# Patient Record
Sex: Female | Born: 1965
Health system: Southern US, Community
[De-identification: ages and names within clinical notes are randomized; demographics above are authoritative.]

## PROBLEM LIST (undated history)

## (undated) DIAGNOSIS — R079 Chest pain, unspecified: Secondary | ICD-10-CM

## (undated) DIAGNOSIS — I10 Essential (primary) hypertension: Secondary | ICD-10-CM

## (undated) DIAGNOSIS — R002 Palpitations: Secondary | ICD-10-CM

## (undated) HISTORY — DX: Palpitations: R00.2

## (undated) HISTORY — DX: Chest pain, unspecified: R07.9

---

## 1997-10-28 ENCOUNTER — Ambulatory Visit (HOSPITAL_COMMUNITY): Admission: RE | Admit: 1997-10-28 | Discharge: 1997-10-28 | Payer: Self-pay | Admitting: Ophthalmology

## 1999-01-01 ENCOUNTER — Encounter: Payer: Self-pay | Admitting: Obstetrics and Gynecology

## 1999-01-01 ENCOUNTER — Ambulatory Visit (HOSPITAL_COMMUNITY): Admission: RE | Admit: 1999-01-01 | Discharge: 1999-01-01 | Payer: Self-pay | Admitting: Obstetrics and Gynecology

## 1999-03-11 ENCOUNTER — Ambulatory Visit (HOSPITAL_COMMUNITY): Admission: RE | Admit: 1999-03-11 | Discharge: 1999-03-11 | Payer: Self-pay | Admitting: Obstetrics and Gynecology

## 1999-03-11 ENCOUNTER — Encounter: Payer: Self-pay | Admitting: Obstetrics and Gynecology

## 1999-03-30 ENCOUNTER — Ambulatory Visit (HOSPITAL_COMMUNITY): Admission: RE | Admit: 1999-03-30 | Discharge: 1999-03-30 | Payer: Self-pay | Admitting: Obstetrics and Gynecology

## 1999-04-06 ENCOUNTER — Encounter (HOSPITAL_COMMUNITY): Admission: RE | Admit: 1999-04-06 | Discharge: 1999-04-23 | Payer: Self-pay | Admitting: Obstetrics and Gynecology

## 1999-04-06 ENCOUNTER — Inpatient Hospital Stay (HOSPITAL_COMMUNITY): Admission: AD | Admit: 1999-04-06 | Discharge: 1999-04-06 | Payer: Self-pay | Admitting: Obstetrics and Gynecology

## 1999-04-08 ENCOUNTER — Inpatient Hospital Stay (HOSPITAL_COMMUNITY): Admission: AD | Admit: 1999-04-08 | Discharge: 1999-04-12 | Payer: Self-pay | Admitting: Obstetrics and Gynecology

## 1999-04-09 ENCOUNTER — Encounter: Payer: Self-pay | Admitting: Obstetrics and Gynecology

## 1999-04-22 ENCOUNTER — Inpatient Hospital Stay (HOSPITAL_COMMUNITY): Admission: AD | Admit: 1999-04-22 | Discharge: 1999-04-26 | Payer: Self-pay | Admitting: Obstetrics and Gynecology

## 1999-04-22 ENCOUNTER — Encounter (INDEPENDENT_AMBULATORY_CARE_PROVIDER_SITE_OTHER): Payer: Self-pay

## 1999-04-29 ENCOUNTER — Emergency Department (HOSPITAL_COMMUNITY): Admission: EM | Admit: 1999-04-29 | Discharge: 1999-04-29 | Payer: Self-pay | Admitting: Emergency Medicine

## 2000-01-24 ENCOUNTER — Other Ambulatory Visit: Admission: RE | Admit: 2000-01-24 | Discharge: 2000-01-24 | Payer: Self-pay | Admitting: Obstetrics and Gynecology

## 2000-02-24 ENCOUNTER — Ambulatory Visit (HOSPITAL_COMMUNITY): Admission: RE | Admit: 2000-02-24 | Discharge: 2000-02-24 | Payer: Self-pay | Admitting: Family Medicine

## 2000-02-24 ENCOUNTER — Encounter: Payer: Self-pay | Admitting: Family Medicine

## 2002-04-12 ENCOUNTER — Other Ambulatory Visit: Admission: RE | Admit: 2002-04-12 | Discharge: 2002-04-12 | Payer: Self-pay | Admitting: Obstetrics & Gynecology

## 2002-10-14 ENCOUNTER — Encounter: Payer: Self-pay | Admitting: Obstetrics & Gynecology

## 2002-10-14 ENCOUNTER — Ambulatory Visit (HOSPITAL_COMMUNITY): Admission: RE | Admit: 2002-10-14 | Discharge: 2002-10-14 | Payer: Self-pay | Admitting: Obstetrics & Gynecology

## 2002-10-15 ENCOUNTER — Inpatient Hospital Stay (HOSPITAL_COMMUNITY): Admission: AD | Admit: 2002-10-15 | Discharge: 2002-10-17 | Payer: Self-pay | Admitting: Obstetrics & Gynecology

## 2002-10-15 ENCOUNTER — Encounter (INDEPENDENT_AMBULATORY_CARE_PROVIDER_SITE_OTHER): Payer: Self-pay

## 2002-10-16 ENCOUNTER — Encounter (INDEPENDENT_AMBULATORY_CARE_PROVIDER_SITE_OTHER): Payer: Self-pay | Admitting: Specialist

## 2002-11-26 ENCOUNTER — Other Ambulatory Visit: Admission: RE | Admit: 2002-11-26 | Discharge: 2002-11-26 | Payer: Self-pay | Admitting: Obstetrics & Gynecology

## 2003-06-23 ENCOUNTER — Emergency Department (HOSPITAL_COMMUNITY): Admission: EM | Admit: 2003-06-23 | Discharge: 2003-06-23 | Payer: Self-pay | Admitting: Emergency Medicine

## 2003-08-08 ENCOUNTER — Ambulatory Visit (HOSPITAL_COMMUNITY): Admission: RE | Admit: 2003-08-08 | Discharge: 2003-08-08 | Payer: Self-pay | Admitting: Family Medicine

## 2005-01-23 ENCOUNTER — Emergency Department (HOSPITAL_COMMUNITY): Admission: EM | Admit: 2005-01-23 | Discharge: 2005-01-23 | Payer: Self-pay | Admitting: Emergency Medicine

## 2005-11-23 ENCOUNTER — Other Ambulatory Visit: Admission: RE | Admit: 2005-11-23 | Discharge: 2005-11-23 | Payer: Self-pay | Admitting: Obstetrics and Gynecology

## 2007-08-02 ENCOUNTER — Emergency Department (HOSPITAL_COMMUNITY): Admission: EM | Admit: 2007-08-02 | Discharge: 2007-08-02 | Payer: Self-pay | Admitting: Emergency Medicine

## 2007-10-27 ENCOUNTER — Emergency Department (HOSPITAL_COMMUNITY): Admission: EM | Admit: 2007-10-27 | Discharge: 2007-10-28 | Payer: Self-pay | Admitting: Emergency Medicine

## 2009-02-13 ENCOUNTER — Emergency Department (HOSPITAL_COMMUNITY): Admission: EM | Admit: 2009-02-13 | Discharge: 2009-02-13 | Payer: Self-pay | Admitting: Emergency Medicine

## 2009-07-26 IMAGING — US US SOFT TISSUE HEAD/NECK
1 series · 14 of 25 positions shown · non-contrast
Comparison: Neck CT on 10/27/2007

CLINICAL DATA: Left thyroid nodule.

THYROID ULTRASOUND
TECHNIQUE: Ultrasound examination of the thyroid gland and all
adjacent soft tissues was performed.

[Series 1: unknown · 0.11mm/px · 14 of 52 slices shown]
[im 1/52]
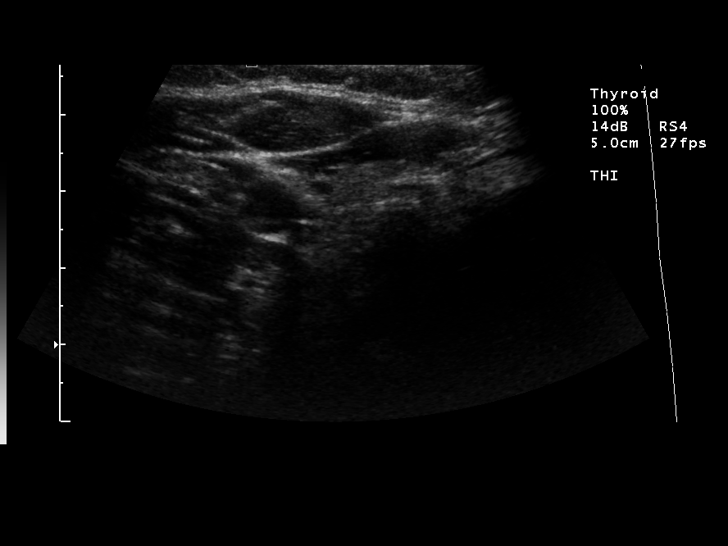
[im 5/52]
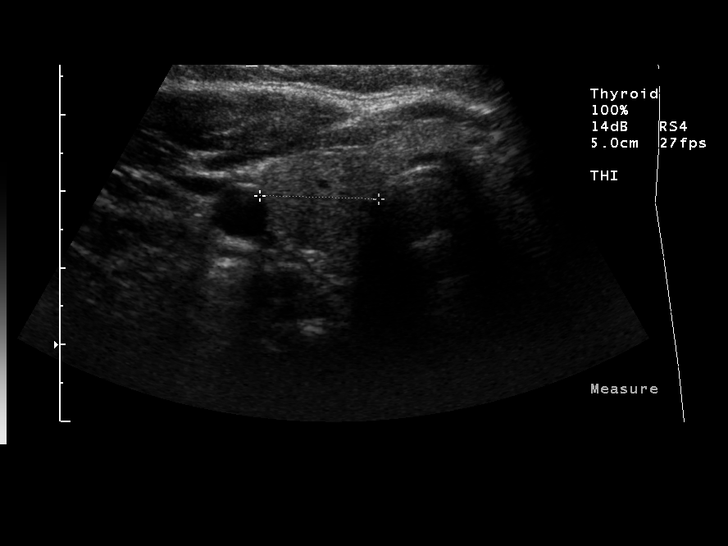
[im 9/52]
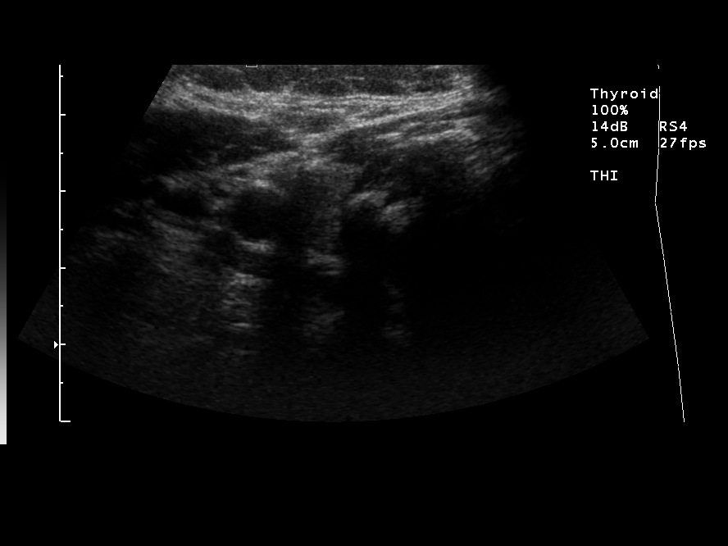
[im 13/52]
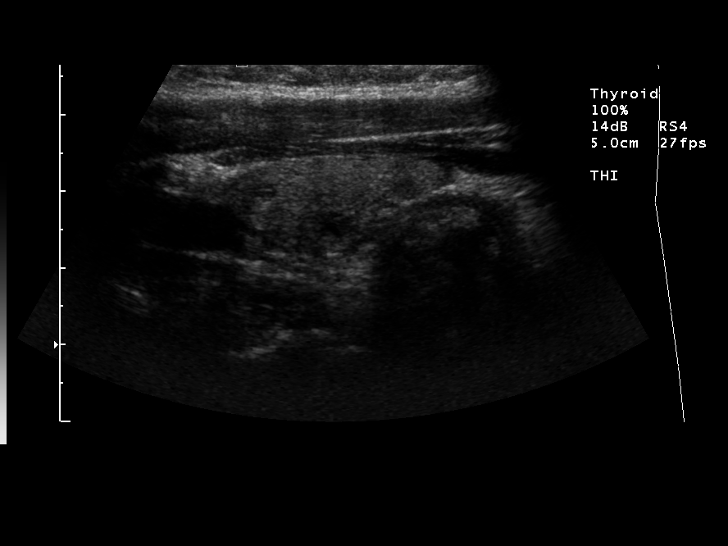
[im 18/52]
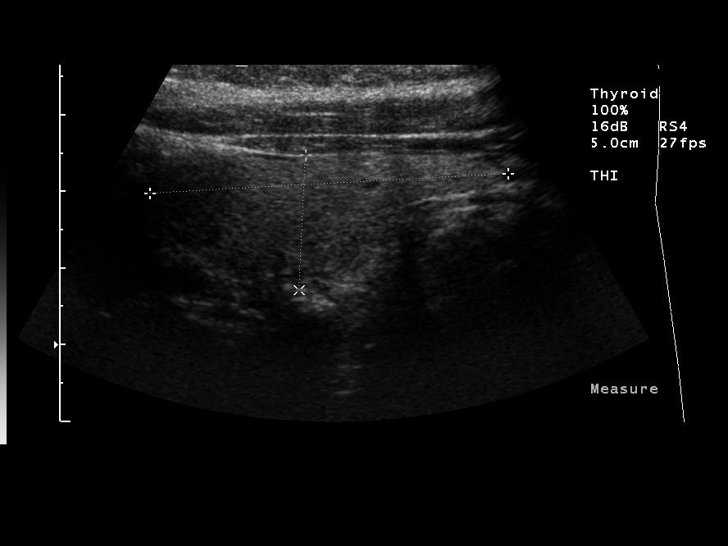
[im 20/52]
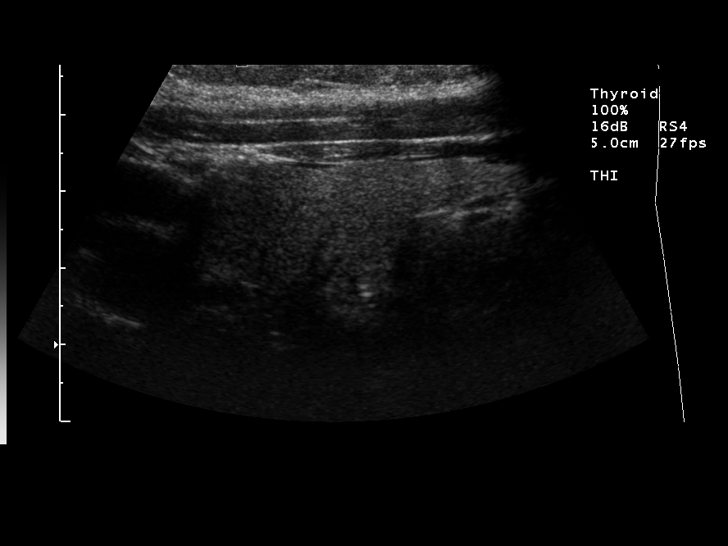
[im 24/52]
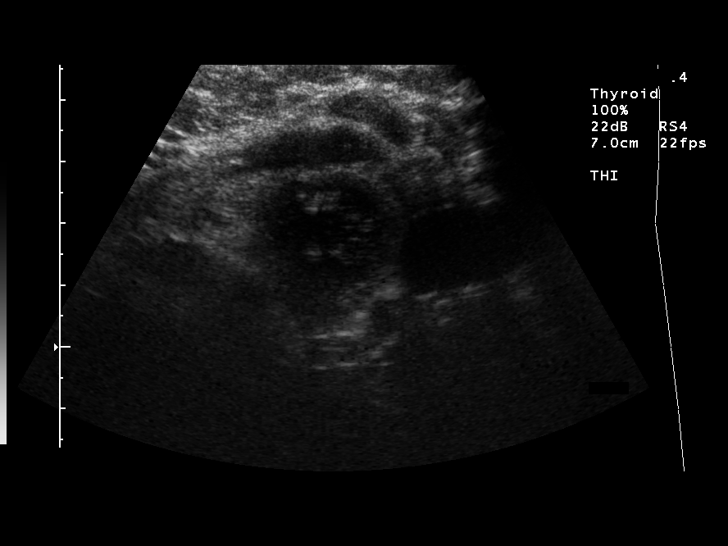
[im 28/52]
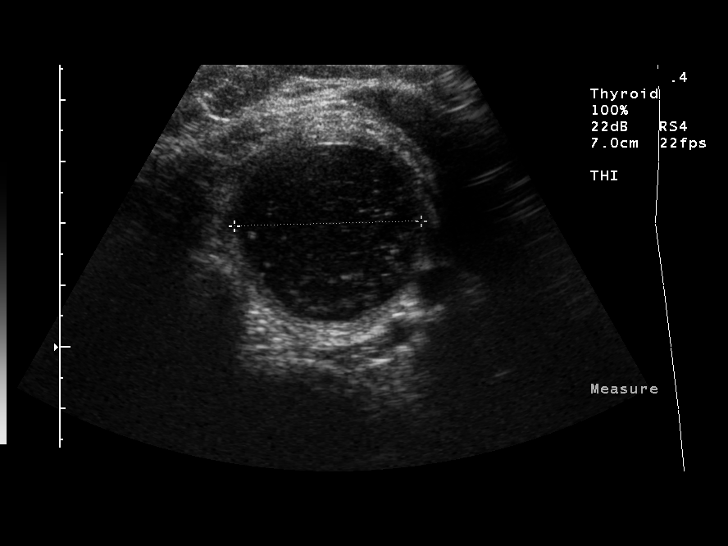
[im 32/52]
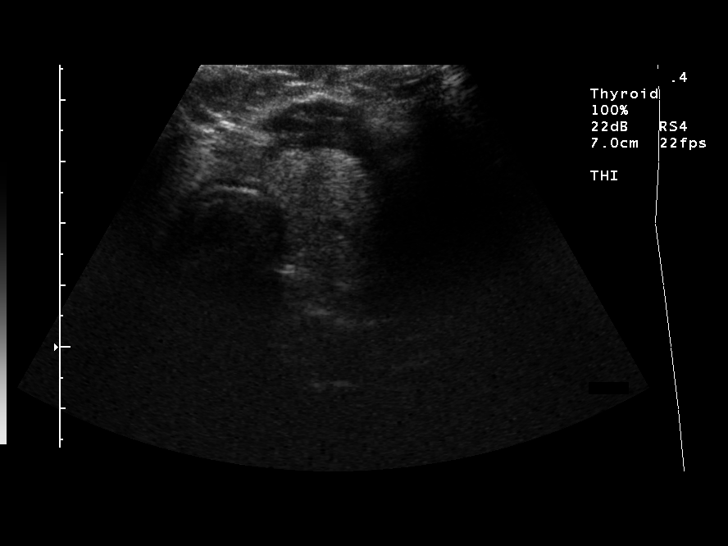
[im 35/52]
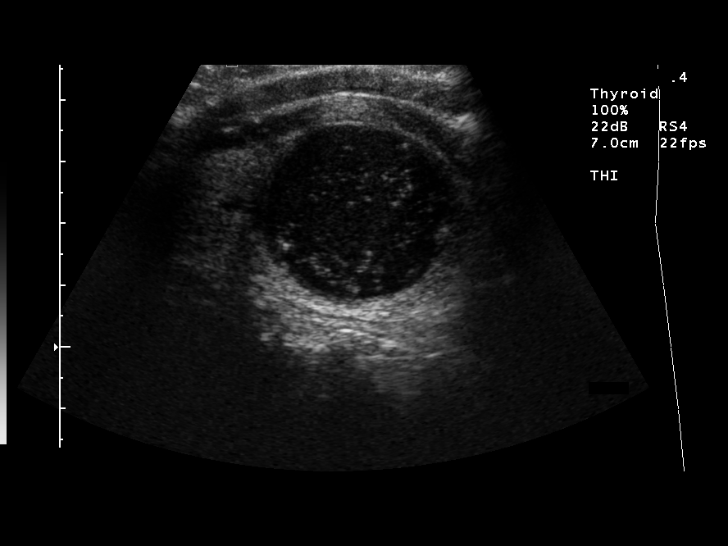
[im 39/52]
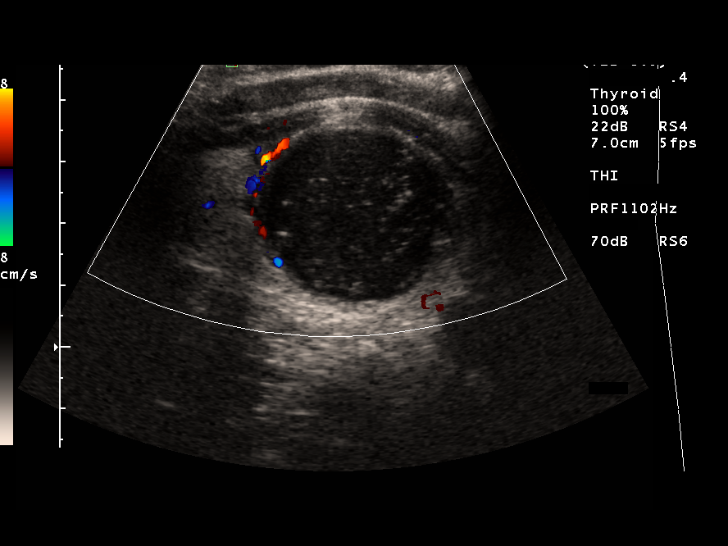
[im 43/52]
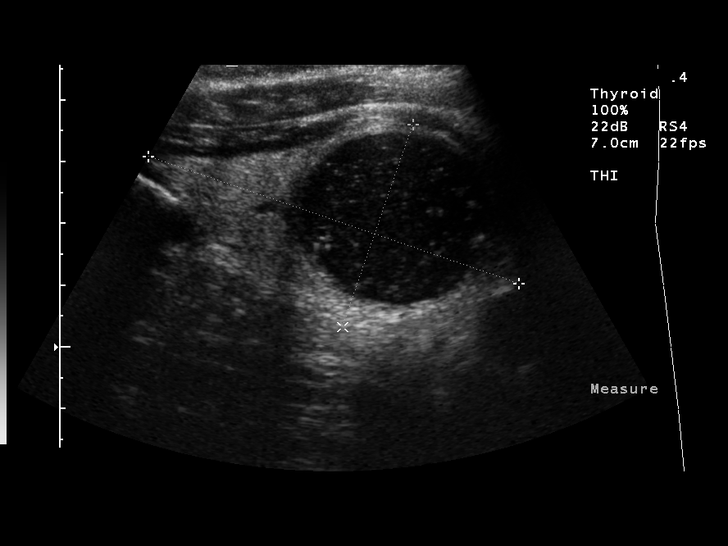
[im 47/52]
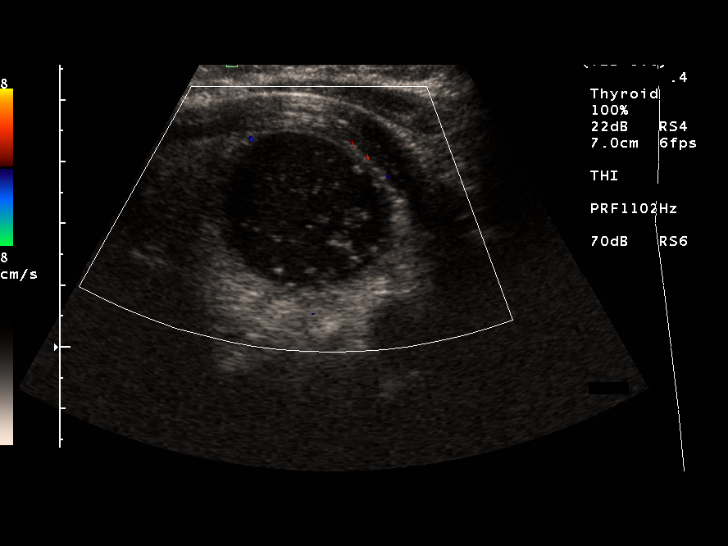
[im 52/52]
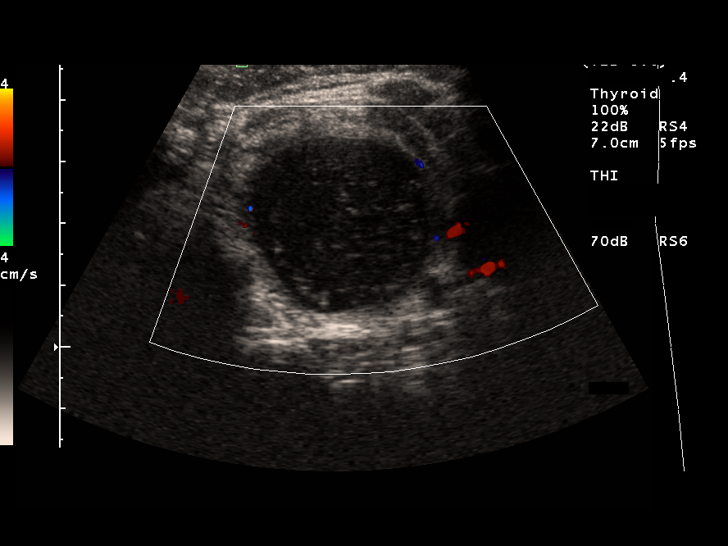

[14 of 25 positions shown; findings below may reference images not displayed]

FINDINGS: The left thyroid lobe contains a large complex cyst with
low-level internal echoes which shows no evidence of internal blood
flow on color Doppler ultrasound.  This measures 3.1 x 3.1 x 3.0 cm
and is consistent with a colloid cyst.  This corresponds with the
lesions seen on recent CT.  Several tiny hypoechoic cyst or nodules
measuring up to 6 mm are seen in the right thyroid lobe.
IMPRESSION: 3.1 cm colloid cyst in the left thyroid lobe, which corresponds to
the lesion seen on recent CT.

## 2010-06-30 LAB — URINALYSIS, ROUTINE W REFLEX MICROSCOPIC
Glucose, UA: NEGATIVE mg/dL
Ketones, ur: 15 mg/dL — AB
Nitrite: POSITIVE — AB
Protein, ur: 100 mg/dL — AB
Specific Gravity, Urine: 1.022 (ref 1.005–1.030)
Urobilinogen, UA: 1 mg/dL (ref 0.0–1.0)
pH: 6.5 (ref 5.0–8.0)

## 2010-06-30 LAB — POCT I-STAT, CHEM 8
BUN: 9 mg/dL (ref 6–23)
Calcium, Ion: 1.11 mmol/L — ABNORMAL LOW (ref 1.12–1.32)
Chloride: 102 mEq/L (ref 96–112)
Creatinine, Ser: 1.1 mg/dL (ref 0.4–1.2)
Glucose, Bld: 79 mg/dL (ref 70–99)
HCT: 41 % (ref 36.0–46.0)
Hemoglobin: 13.9 g/dL (ref 12.0–15.0)
Potassium: 3.5 mEq/L (ref 3.5–5.1)
Sodium: 140 mEq/L (ref 135–145)
TCO2: 29 mmol/L (ref 0–100)

## 2010-06-30 LAB — GC/CHLAMYDIA PROBE AMP, GENITAL
Chlamydia, DNA Probe: NEGATIVE
GC Probe Amp, Genital: NEGATIVE

## 2010-06-30 LAB — WET PREP, GENITAL
Clue Cells Wet Prep HPF POC: NONE SEEN
Trich, Wet Prep: NONE SEEN
Yeast Wet Prep HPF POC: NONE SEEN

## 2010-06-30 LAB — URINE MICROSCOPIC-ADD ON

## 2010-06-30 LAB — POCT PREGNANCY, URINE: Preg Test, Ur: NEGATIVE

## 2010-08-13 NOTE — Op Note (Signed)
   NAME:  Kayla Anthony, Kayla Anthony                        ACCOUNT NO.:  [id number]   MEDICAL RECORD NO.:  [id number]                   PATIENT TYPE:  INP   LOCATION:  9136                                 FACILITY:  [facility]   PHYSICIAN:  Genia Del, M.D.             DATE OF BIRTH:  08-Sep-1965   DATE OF PROCEDURE:  10/16/2002  DATE OF DISCHARGE:                                 OPERATIVE REPORT   PREOPERATIVE DIAGNOSIS:  Desires bilateral tubal sterilization, postpartum.   POSTOPERATIVE DIAGNOSIS:  Desires bilateral tubal sterilization, postpartum.   INTERVENTION:  Bilateral tubal sterilization by modified Pomeroy through a  mini-laparotomy.   SURGEON:  Genia Del, M.D.   ANESTHESIOLOGIST:  Dr. Thamas Jaegers   PROCEDURE:  Under epidural anesthesia, the patient is placed in the dorsal  position.  She is prepped with Hibiclens in the abdominal area and then  draped as usual.  We made an infraumbilical incision 3 cm with a scalpel.  We opened the aponeurosis with the Mayo scissors and the peritoneum bluntly.  We used two retractors and a curet to direct our attention towards the left  tube first.  The left tube was grasped with the Babcock clamp, we identified  the fimbriated end.  We then opened a window in the mesosalpinx with the  Metzenbaum scissors.  We used a hemostat to pass the plain 2-0 suture.  We  sutured the distal part of the tube and then proximally at around 2 cm from  the cornua.  We cut with Metzenbaum scissors inbetween a section of the tube  and is sent to pathology.  We cauterized both extremities of the cut tube  and assured that hemostasis is adequate.  The sutures are cut.  We proceeded  exactly the same way on the right side, although a small bleeder is present  at the level of the mesosalpinx which is cauterized with the electrocautery.  We doubled the suture on the proximal end of the tube to control hemostasis.  Hemostasis was adequate.  We then closed the  aponeurosis with a running  suture of 0 Vicryl.  We closed the skin with a subcuticular of 4-0 Monocryl.  Hemostasis was adequate at the incision.  The estimated blood loss was less  than 25 mL.  The sponge and instrument counts were complete.  No  complications occurred.  The patient was brought to the recovery room in  satisfactory condition.                                               Genia Del, M.D.    ML/MEDQ  D:  10/16/2002  T:  10/16/2002  Job:  [id number]
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
## 2010-08-13 NOTE — Op Note (Signed)
[facility]  Patient:    Kayla Anthony                     MRN: [id number] Proc. Date: 04/22/99 Adm. Date:  [id number] Attending:  Malon Kindle                           Operative Report  PREOPERATIVE DIAGNOSIS:       Intrauterine pregnancy at 34 weeks 3 days twin gestation, premature rupture of membranes with early labor, babies presenting vertex/breech and probable double footling breech.  POSTOPERATIVE DIAGNOSIS:  OPERATION:                    Low transverse cesarean section.  SURGEON:                      Malachi Pro. Ambrose Mantle, M.D.  ASSISTANT:  ANESTHESIA:                   Spinal.  ESTIMATED BLOOD LOSS:  DESCRIPTION OF PROCEDURE:     The patient was brought to the operating room and  given a spinal anesthetic by Belva Agee, M.D.  She was placed in the left lateral tilt position.  The abdomen was prepped with Betadine solution.  The urethra was prepped and a Foley catheter was inserted to straight drain.  The abdomen was then draped as a sterile field and after anesthesia was confirmed a  transverse incision was made and carried in layers through the skin, subcutaneous tissue, and fascia.  The rectus muscles were already widely separated.  The peritoneum was opened bluntly.  The lower uterine segment was exposed and incision was made into the lower uterine segment peritoneum.  This was extended laterally and the bladder was pushed inferiorly.  The incision was then made into the lower uterine segment.  Clear fluid was obtained.  The incision was extended laterally with the bandage scissors.  The vertex was elevated into the operative field by  baby B.  The nose and pharynx was suctioned with the bulb.  The infant was delivered and the cord was clamped and the infant was given to J. Alphonsa Gin, M.D. who was in attendance.  The sac of baby B was then ruptured.  Both feet presented into the incisional opening as  well as a hand.  I repositioned the hand and then was able to pull on the legs and bring the buttocks down through the incisional opening.  I delivered both shoulders, suctioned the mouth, and delivered the aftercoming head.  The cord was clamped, the infant was given to J. Alphonsa Gin, M.D. who was in attendance.  The placenta, inspected the inside of the uterus and found it to be free of any products of conception.  Then after drawing cord blood from both cords, I repaired the uterus by closing the first layer with a locked suture of 0 Vicryl, the second layer a nonlocking suture of the same material.  A couple of extra sutures were used for reinforcing the myometrium.  Liberal irrigation confirmed hemostasis.  Both tubes and ovaries appeared completely normal as did the uterus.  I searched for any bleeding sites, took care of what little bleeding there was, tried to remove all debris from the pelvic cavity.  I then closed the abdominal wall by reapproximating the rectus muscle ith interrupted sutures of 0 Vicryl, the fascia  with two running sutures of 0 Vicryl, subcu with a running 3-0 Vicryl, and the skin with automatic staples.  The patient seemed to tolerate the procedure well.  Estimated blood loss was about 1000 cc.  Baby A was a female 5 pounds 5 ounces with Apgars of 8 and 9.  Baby B was female 5 pounds 7 ounces with Apgars of 8 and 9. DD:  04/22/99 TD:  04/22/99 Job: [id number]

## 2010-12-24 LAB — POCT I-STAT, CHEM 8
BUN: 12
Calcium, Ion: 1.17
Chloride: 102
Creatinine, Ser: 0.9
Glucose, Bld: 99
HCT: 39
Hemoglobin: 13.3
Potassium: 3.3 — ABNORMAL LOW
Sodium: 139
TCO2: 27

## 2014-08-21 ENCOUNTER — Telehealth: Payer: Self-pay | Admitting: Cardiovascular Disease

## 2014-08-21 NOTE — Telephone Encounter (Signed)
[facility]01/14 with Dr Allyson SabalBerry.  [facility] for Dr Hazle CocaBerry's schedule on 10/01/14. lp

## 2014-10-01 ENCOUNTER — Ambulatory Visit (INDEPENDENT_AMBULATORY_CARE_PROVIDER_SITE_OTHER): Payer: 59

## 2014-10-01 ENCOUNTER — Ambulatory Visit (INDEPENDENT_AMBULATORY_CARE_PROVIDER_SITE_OTHER): Payer: 59 | Admitting: Cardiovascular Disease

## 2014-10-01 ENCOUNTER — Encounter: Payer: Self-pay | Admitting: Cardiovascular Disease

## 2014-10-01 VITALS — BP 110/74 | HR 72 | Ht 65.5 in | Wt 219.0 lb

## 2014-10-01 DIAGNOSIS — R002 Palpitations: Secondary | ICD-10-CM

## 2014-10-01 DIAGNOSIS — I10 Essential (primary) hypertension: Secondary | ICD-10-CM | POA: Diagnosis not present

## 2014-10-01 DIAGNOSIS — R079 Chest pain, unspecified: Secondary | ICD-10-CM

## 2014-10-01 NOTE — Assessment & Plan Note (Signed)
This right was referred for evaluation of chest pain. This began 6 months ago and has become more frequent and noticeable. The episodes feel like squeezing her chest lasting a second or 2 followed by some palpitations after that. There is no radiation. Usually occurs while she is relaxing watching TV. Her risk factors are negative other than for hypertension and family history. Her father died of a myocardial infarction at age 47. She has never had a heart attack or stroke. She does drink caffeinated beverages of one to 2 cups per day and has a history of GERD. 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 I'm going to get a 2-D echocardiogram at a routine [facility]. I will see her back after that for further evaluation

## 2014-10-01 NOTE — Progress Notes (Signed)
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 She is referred by Dr. Holley Bouche for cardiovascular evaluation because of a 6 month history of substernal chest pressure with palpitations. Her cardiac risk factor profile is unremarkable for treated hypertension and family history with a father who died of a myocardial infarction at age 76. She does drink one to 2 cups of caffeinated beverages a day and drinks alcohol socially. She does not smoke. He does have GERD. 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 She's had episodes of chest pressure beginning 6 months ago which have become more frequent and nature. They're characterized by a substernal chest pressure lasting for seconds at a time followed by several palpitations.   Current Outpatient Prescriptions  Medication Sig Dispense Refill  . cyclobenzaprine (FLEXERIL) 10 MG tablet Take 10 mg by mouth daily as needed for muscle spasms.    . metoprolol succinate (TOPROL-XL) 100 MG 24 hr tablet Take 100 mg by mouth daily. Take with or immediately following a meal.    . Multiple Vitamin (MULTIVITAMIN) capsule Take 1 capsule by mouth daily.    . sertraline (ZOLOFT) 100 MG tablet Take 100 mg by mouth daily.    . traZODone (DESYREL) 50 MG tablet Take 50 mg by mouth at bedtime.     No current facility-administered medications for this visit.    No Known Allergies  History   Social History  . Marital Status: Married    Spouse Name: N/A  . Number of Children: N/A  . Years of Education: N/A   Occupational History  . Not on file.   Social History Main Topics  . Smoking status: Never Smoker   . Smokeless tobacco: Not on file  . Alcohol  Use: Not on file  . Drug Use: Not on file  . Sexual Activity: Not on file   Other Topics Concern  . Not on file   Social History Narrative  . No narrative on file     Review of Systems: General: negative for chills, fever, night sweats or weight changes.  Cardiovascular: negative for chest pain, dyspnea on exertion, edema, orthopnea, palpitations, paroxysmal nocturnal dyspnea or shortness of breath Dermatological: negative for rash Respiratory: negative for cough or wheezing Urologic: negative for hematuria Abdominal: negative for nausea, vomiting, diarrhea, bright red blood per rectum, melena, or hematemesis Neurologic: negative for visual changes, syncope, or dizziness All other systems reviewed and are otherwise negative except as noted above.    Blood pressure 110/74, pulse 72, height 5' 5.5" (1.664 m), weight 219 lb (99.338 kg).  General appearance: alert and no distress Neck: no adenopathy, no carotid bruit, no JVD, supple, symmetrical, trachea midline and thyroid not enlarged, symmetric, no tenderness/mass/nodules Lungs: clear to auscultation bilaterally Heart: regular rate and rhythm, S1, S2 normal, no murmur, click, rub or gallop Extremities: extremities normal, atraumatic, no cyanosis or edema  EKG normal sinus rhythm at 72 with poor R-wave progression suggesting an old lateral infarct. I will obtain an old EKG to compare. I personally reviewed this EKG  ASSESSMENT AND PLAN:   Chest pain This right was referred for evaluation of chest pain. This began 6 months ago and has become more frequent and noticeable. The episodes  feel like squeezing her chest lasting a second or 2 followed by some palpitations after that. There is no radiation. Usually occurs while she is relaxing watching TV. Her risk factors are negative other than for hypertension and family history. Her father died of a myocardial infarction at age 47. She has never had a heart attack or stroke. She does drink  caffeinated beverages of one to 2 cups per day and has a history of GERD. 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 I'm going to get a 2-D echocardiogram at a routine [facility]. I will see her back after that for further evaluation  Palpitations History of palpitations over the last 6 months associated with her chest heaviness/pressure. He is a brief and occurred after her episodes of chest pressure. She does drink caffeinated beverages. We will obtain her most recent thyroid function tests and place a 30 day event monitor on her to further define what these palpitations are      Runell GessJonathan J. Loney Domingo MD [facility] VirginiaFACP,FACC,FAHA, [facility] IncFSCAI 10/01/2014 12:17 PM

## 2014-10-01 NOTE — Assessment & Plan Note (Signed)
History of palpitations over the last 6 months associated with her chest heaviness/pressure. He is a brief and occurred after her episodes of chest pressure. She does drink caffeinated beverages. We will obtain her most recent thyroid function tests and place a 30 day event monitor on her to further define what these palpitations are

## 2014-10-01 NOTE — Patient Instructions (Signed)
Medication Instructions:   Continue your current medications  Labwork:  I will try to contact your PCP and request your most recent thyroid function studies  Testing/Procedures:  1.Exercise tolerance test. For further information please visit [email or web address]. Please also follow instruction sheet, as given.  2. Echocardiogram. Echocardiography is a painless test that uses sound waves to create images of your heart. It provides your doctor with information about the size and shape of your heart and how well your heart's chambers and valves are working. This procedure takes approximately one hour. There are no restrictions for this procedure.  3. Event monitor. Event monitors are medical devices that record the heart's electrical activity. Doctors most often us these monitors to diagnose arrhythmias. Arrhythmias are problems with the speed or rhythm of the heartbeat. The monitor is a small, portable device. You can wear one while you do your normal daily activities. This is usually used to diagnose what is causing palpitations/syncope (passing out).   Follow-Up:  With Dr Allyson SabalBerry after the above tests.   Any Other Special Instructions Will Be Listed Below (If Applicable).  Your Doctor has ordered you to wear a heart monitor. You will wear this for 30 days.   TIPS -  REMINDERS 1. The sensor is the lanyard that is worn around your neck every day - this is powered by a battery that needs to be changed every day 2. The monitor is the device that allows you to record symptoms - this will need to be charged daily 3. The sensor & monitor need to be within 100 feet of each other at all times 4. The sensor connects to the electrodes (stickers) - these should be changed every 24-48 hours (you do not have to remove them when you bathe, just make sure they are dry when you connect it back to the sensor 5. If you need more supplies (electrodes, batteries), please call the 1-800 # on the back of the  pamphlet and [employer] will mail you more supplies 6. If your skin becomes sensitive, please try the sample pack of sensitive skin electrodes (the white packet in your silver box) and call [employer] to have them mail you more of these type of electrodes 7. When you are finish wearing the monitor, please place all supplies back in the silver box, place the silver box in the pre-packaged UPS bag and drop off at [employer] or call them so they can come pick it up   Cardiac Event Monitoring A cardiac event monitor is a small recording device used to help detect abnormal heart rhythms (arrhythmias). The monitor is used to record heart rhythm when noticeable symptoms such as the following occur:  Fast heartbeats (palpitations), such as heart racing or fluttering.  Dizziness.  Fainting or light-headedness.  Unexplained weakness. The monitor is wired to two electrodes placed on your chest. Electrodes are flat, sticky disks that attach to your skin. The monitor can be worn for up to 30 days. You will wear the monitor at all times, except when bathing.  HOW TO USE YOUR CARDIAC EVENT MONITOR A technician will prepare your chest for the electrode placement. The technician will show you how to place the electrodes, how to work the monitor, and how to replace the batteries. Take time to practice using the monitor before you leave the office. Make sure you understand how to send the information from the monitor to your health care provider. This requires a telephone with a landline, not a cell phone. You  need to:  Wear your monitor at all times, except when you are in water:  Do not get the monitor wet.  Take the monitor off when bathing. Do not swim or use a hot tub with it on.  Keep your skin clean. Do not put body lotion or moisturizer on your chest.  Change the electrodes daily or any time they stop sticking to your skin. You might need to use tape to keep them on.  It is possible that your skin under the  electrodes could become irritated. To keep this from happening, try to put the electrodes in slightly different places on your chest. However, they must remain in the area under your left breast and in the upper right section of your chest.  Make sure the monitor is safely clipped to your clothing or in a location close to your body that your health care provider recommends.  Press the button to record when you feel symptoms of heart trouble, such as dizziness, weakness, light-headedness, palpitations, thumping, shortness of breath, unexplained weakness, or a fluttering or racing heart. The monitor is always on and records what happened slightly before you pressed the button, so do not worry about being too late to get good information.  Keep a diary of your activities, such as walking, doing chores, and taking medicine. It is especially important to note what you were doing when you pushed the button to record your symptoms. This will help your health care provider determine what might be contributing to your symptoms. The information stored in your monitor will be reviewed by your health care provider alongside your diary entries.  Send the recorded information as recommended by your health care provider. It is important to understand that it will take some time for your health care provider to process the results.  Change the batteries as recommended by your health care provider. SEEK IMMEDIATE MEDICAL CARE IF:   You have chest pain.  You have extreme difficulty breathing or shortness of breath.  You develop a very fast heartbeat that persists.  You develop dizziness that does not go away.  You faint or constantly feel you are about to faint. Document Released: 12/22/2007 Document Revised: 07/29/2013 Document Reviewed: 09/10/2012 [facility] Patient Information 2015 [place], [place]. This information is not intended to replace advice given to you by your health care provider. Make sure you  discuss any questions you have with your health care provider.

## 2014-10-02 ENCOUNTER — Telehealth: Payer: Self-pay | Admitting: Cardiovascular Disease

## 2014-10-02 NOTE — Telephone Encounter (Signed)
[facility] and lab results (thyroid).  Faxed on 10/01/14. lp

## 2014-10-13 ENCOUNTER — Telehealth: Payer: Self-pay | Admitting: Cardiovascular Disease

## 2014-10-13 NOTE — Telephone Encounter (Signed)
Pt stated she figured out answer to her question - called [employer] & they are shipping out supplies. Advised if she runs out in interim we can supply 1-2 days for her. She verbalized understanding.

## 2014-10-13 NOTE — Telephone Encounter (Signed)
Pt is wearing [employer] monitor,needs some electrodes.Where can she get some ?

## 2014-10-15 ENCOUNTER — Telehealth: Payer: Self-pay | Admitting: Cardiovascular Disease

## 2014-10-15 NOTE — Telephone Encounter (Signed)
LMTCB

## 2014-10-15 NOTE — Telephone Encounter (Signed)
Spoke with patient who states she is still having heart symptoms such as those she was initially evaluated by Dr. Allyson SabalBerry for. She states last night at work she felt "weird" and feels like there is a heart squeezing sensation, a thump and then an adrenaline rush in her gut (which she states is new and unsure if is related to anxiety?). Explained to patient that in the event that something urgent is reported on her monitor, our office would be notified. Explained to patient that Dr. Allyson SabalBerry ordered all the appropriate tests to evaluated her symptoms - monitor to assess heart rate/rhythm, echo to assess valves/chambers/pump function, ETT to assess functionality and flow of blood in heart. This info seemed to alleviate patient's concerns. She is aware she will be notified of her results.

## 2014-10-15 NOTE — Telephone Encounter (Signed)
Pt is wearing monitor,she is having some symptoms now. She is very concerned about these symptoms,heart feels like it is squeezing real hard.

## 2014-10-24 ENCOUNTER — Encounter: Payer: Self-pay | Admitting: Cardiovascular Disease

## 2014-11-10 ENCOUNTER — Telehealth: Payer: Self-pay | Admitting: Cardiovascular Disease

## 2014-11-10 NOTE — Telephone Encounter (Signed)
Pt was calling to get the results to her monitor that she had on 7/6. Please f/u with her   Thanks

## 2014-11-10 NOTE — Telephone Encounter (Signed)
Results of monitor called to patient.  Instructed to continue plan of care

## 2014-11-17 ENCOUNTER — Encounter: Payer: Self-pay | Admitting: Cardiovascular Disease

## 2014-11-17 NOTE — Telephone Encounter (Signed)
Can this encounter be closed?

## 2014-11-27 ENCOUNTER — Ambulatory Visit (HOSPITAL_COMMUNITY): Payer: 59

## 2014-11-27 ENCOUNTER — Encounter: Payer: Self-pay | Admitting: Cardiovascular Disease

## 2014-11-27 ENCOUNTER — Ambulatory Visit (INDEPENDENT_AMBULATORY_CARE_PROVIDER_SITE_OTHER): Payer: 59

## 2014-11-27 ENCOUNTER — Encounter: Payer: 59 | Admitting: Physician Assistant

## 2014-11-27 DIAGNOSIS — R002 Palpitations: Secondary | ICD-10-CM

## 2014-11-27 DIAGNOSIS — R079 Chest pain, unspecified: Secondary | ICD-10-CM | POA: Diagnosis not present

## 2014-11-27 LAB — EXERCISE TOLERANCE TEST
Estimated workload: 12 METS
Exercise duration (min): 10 min
Exercise duration (sec): 11 s
MPHR: 174 {beats}/min
Peak HR: 150 {beats}/min
Percent HR: 86 %
RPE: 17
Rest HR: 71 {beats}/min

## 2014-12-02 ENCOUNTER — Ambulatory Visit: Payer: 59 | Admitting: Cardiovascular Disease

## 2014-12-02 ENCOUNTER — Encounter: Payer: Self-pay | Admitting: *Deleted

## 2014-12-08 ENCOUNTER — Other Ambulatory Visit (HOSPITAL_COMMUNITY): Payer: 59

## 2015-05-03 ENCOUNTER — Encounter (HOSPITAL_COMMUNITY): Payer: Self-pay | Admitting: Emergency Medicine

## 2015-05-03 ENCOUNTER — Emergency Department (HOSPITAL_COMMUNITY)
Admission: EM | Admit: 2015-05-03 | Discharge: 2015-05-03 | Disposition: A | Discharge status: Home / Self Care | Payer: BLUE CROSS/BLUE SHIELD | Attending: Emergency Medicine | Admitting: Emergency Medicine

## 2015-05-03 DIAGNOSIS — Z79899 Other long term (current) drug therapy: Secondary | ICD-10-CM | POA: Insufficient documentation

## 2015-05-03 DIAGNOSIS — S0031XA Abrasion of nose, initial encounter: Secondary | ICD-10-CM | POA: Insufficient documentation

## 2015-05-03 DIAGNOSIS — S0081XA Abrasion of other part of head, initial encounter: Secondary | ICD-10-CM | POA: Diagnosis not present

## 2015-05-03 DIAGNOSIS — Y998 Other external cause status: Secondary | ICD-10-CM | POA: Insufficient documentation

## 2015-05-03 DIAGNOSIS — Y92410 Unspecified street and highway as the place of occurrence of the external cause: Secondary | ICD-10-CM | POA: Diagnosis not present

## 2015-05-03 DIAGNOSIS — Z23 Encounter for immunization: Secondary | ICD-10-CM | POA: Diagnosis not present

## 2015-05-03 DIAGNOSIS — T07XXXA Unspecified multiple injuries, initial encounter: Secondary | ICD-10-CM

## 2015-05-03 DIAGNOSIS — S00212A Abrasion of left eyelid and periocular area, initial encounter: Secondary | ICD-10-CM | POA: Diagnosis not present

## 2015-05-03 DIAGNOSIS — Y9389 Activity, other specified: Secondary | ICD-10-CM | POA: Insufficient documentation

## 2015-05-03 DIAGNOSIS — S00211A Abrasion of right eyelid and periocular area, initial encounter: Secondary | ICD-10-CM | POA: Diagnosis not present

## 2015-05-03 DIAGNOSIS — I10 Essential (primary) hypertension: Secondary | ICD-10-CM | POA: Insufficient documentation

## 2015-05-03 DIAGNOSIS — S0993XA Unspecified injury of face, initial encounter: Secondary | ICD-10-CM | POA: Diagnosis present

## 2015-05-03 HISTORY — DX: Essential (primary) hypertension: I10

## 2015-05-03 MED ORDER — IBUPROFEN 200 MG PO TABS
600.0000 mg | ORAL_TABLET | Freq: Once | ORAL | Status: AC
  Administered 2015-05-03: 600 mg via ORAL
  Filled 2015-05-03: qty 3

## 2015-05-03 MED ORDER — BACITRACIN ZINC 500 UNIT/GM EX OINT
TOPICAL_OINTMENT | Freq: Two times a day (BID) | CUTANEOUS | Status: DC
  Administered 2015-05-03: 2 via TOPICAL
  Filled 2015-05-03: qty 1.8

## 2015-05-03 MED ORDER — TETANUS-DIPHTH-ACELL PERTUSSIS 5-2.5-18.5 LF-MCG/0.5 IM SUSP
0.5000 mL | Freq: Once | INTRAMUSCULAR | Status: AC
  Administered 2015-05-03: 0.5 mL via INTRAMUSCULAR
  Filled 2015-05-03: qty 0.5

## 2015-05-03 MED ORDER — FLUORESCEIN SODIUM 1 MG OP STRP
1.0000 | ORAL_STRIP | Freq: Once | OPHTHALMIC | Status: AC
  Administered 2015-05-03: 1 via OPHTHALMIC
  Filled 2015-05-03: qty 1

## 2015-05-03 MED ORDER — TETRACAINE HCL 0.5 % OP SOLN
1.0000 [drp] | Freq: Once | OPHTHALMIC | Status: AC
  Administered 2015-05-03: 1 [drp] via OPHTHALMIC
  Filled 2015-05-03: qty 4
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
## 2015-05-03 NOTE — ED Notes (Signed)
Awake. Verbally responsive. A/O x4. Resp even and unlabored. No audible adventitious breath sounds noted. ABC's intact.  
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
## 2015-05-03 NOTE — ED Notes (Signed)
MD at bedside. 

## 2018-03-30 ENCOUNTER — Ambulatory Visit: Payer: Self-pay | Admitting: Psychiatry

## 2018-03-30 ENCOUNTER — Encounter

## 2018-04-19 ENCOUNTER — Encounter: Payer: Self-pay | Admitting: Psychiatry

## 2018-04-19 ENCOUNTER — Ambulatory Visit: Payer: BLUE CROSS/BLUE SHIELD | Admitting: Psychiatry

## 2018-04-19 DIAGNOSIS — F411 Generalized anxiety disorder: Secondary | ICD-10-CM | POA: Diagnosis not present

## 2018-04-19 NOTE — Progress Notes (Signed)
      [facility] Counselor/Therapist Progress Note  Patient ID: Kayla Anthony, MRN: [id number],    Date: 04/19/2018  Time Spent: 48 minutes   Treatment Type: Individual Therapy  Reported Symptoms: Anxious Mood  Mental Status Exam:  Appearance:   Casual and Well Groomed     Behavior:  Appropriate  Motor:  Normal  Speech/Language:   Clear and Coherent  Affect:  Appropriate  Mood:  anxious  Thought process:  normal  Thought content:    WNL  Sensory/Perceptual disturbances:    WNL  Orientation:  oriented to person, place, time/date and situation  Attention:  Good  Concentration:  Good  Memory:  WNL  Fund of knowledge:   Good  Insight:    Good  Judgment:   Good  Impulse Control:  Good   Risk Assessment: Danger to Self:  No Self-injurious Behavior: No Danger to Others: No Duty to Warn:no Physical Aggression / Violence:No  Access to Firearms a concern: No  Gang Involvement:No   Subjective: The client states that she is glad to have made it through the holidays.  Both of her daughters have been home.  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  Her youngest daughter has been having a severe anxiety disorder.  The client sees so much of herself in the way her daughter is responding.  She has been able to set up a counseling appointment here in this office for her. The client herself has found a more stable place inside.  The antidepressant that she takes she feels is being helpful for her in spite of the weight gain that she has experienced.  She states, "I would rather be fat than crazy."  Today we discussed about the clients starting to do some exercise which would augment her antidepressant.  We also discussed the use of hobbies as engaged activities to help reduce her anxiety.  She agreed.  The client is also reading and caring for her mother who recently moved to the area.  Interventions: Assertiveness/Communication,  Solution-Oriented/Positive Psychology, Eye Movement Desensitization and Reprocessing (EMDR) and Insight-Oriented  Diagnosis:   ICD-10-CM   1. Generalized anxiety disorder F41.1     Plan: Hobbies, exercise, boundaries, assertiveness.  Kayla Anthony, [place]

## 2018-06-14 ENCOUNTER — Ambulatory Visit: Payer: BLUE CROSS/BLUE SHIELD | Admitting: Psychiatry

## 2019-01-17 ENCOUNTER — Ambulatory Visit: Payer: BLUE CROSS/BLUE SHIELD | Admitting: Psychiatry

## 2019-05-20 ENCOUNTER — Other Ambulatory Visit: Payer: Self-pay | Admitting: Orthopedic Surgery

## 2019-05-20 DIAGNOSIS — M25561 Pain in right knee: Secondary | ICD-10-CM

## 2019-05-28 ENCOUNTER — Encounter: Payer: Self-pay | Admitting: Psychiatry

## 2019-05-28 ENCOUNTER — Ambulatory Visit (INDEPENDENT_AMBULATORY_CARE_PROVIDER_SITE_OTHER): Payer: 59 | Admitting: Psychiatry

## 2019-05-28 DIAGNOSIS — F411 Generalized anxiety disorder: Secondary | ICD-10-CM | POA: Diagnosis not present

## 2019-05-28 NOTE — Progress Notes (Signed)
      [facility] Counselor/Therapist Progress Note  Patient ID: Kayla Anthony, MRN: [id number],    Date: 05/28/2019  Time Spent: 50 minutes   Treatment Type: Individual Therapy  Reported Symptoms: anxiety  Mental Status Exam:  Appearance:   Casual     Behavior:  Appropriate  Motor:  Normal  Speech/Language:   Clear and Coherent  Affect:  Appropriate  Mood:  anxious  Thought process:  normal  Thought content:    WNL  Sensory/Perceptual disturbances:    WNL  Orientation:  oriented to person, place, time/date and situation  Attention:  Good  Concentration:  Good  Memory:  WNL  Fund of knowledge:   Good  Insight:    Good  Judgment:   Good  Impulse Control:  Good   Risk Assessment: Danger to Self:  No Self-injurious Behavior: No Danger to Others: No Duty to Warn:no Physical Aggression / Violence:No  Access to Firearms a concern: No  Gang Involvement:No   Subjective: I connected with this patient by an approved telecommunication method (video), with her informed consent, and verifying identity and patient privacy.  I was located at my office and patient at her home.  As needed, we discussed the limitations, risks, and security and privacy concerns associated with telehealth service, including the availability and conditions which currently govern in-person appointments and the possibility that 3rd-party payment Hayzen Lorenson not be fully guaranteed and she Tomaz Janis be responsible for charges.  After she indicated understanding, we proceeded with the session.  Also discussed treatment planning, as needed, including ongoing verbal agreement with the plan, the opportunity to ask and answer all questions, her demonstrated understanding of instructions, and her readiness to call the office should symptoms worsen or she feels she is in a crisis state and needs more immediate and tangible assistance. The client states that staying home due to the COVID-19 pandemic this past year has been very  difficult.  She is an introvert by nature but both of her daughters and her husband have been at the house almost 24/7.  She has had a difficult time finding the space in her life for herself.  She identifies herself as an Enneagram 5 wing 4.  She knows she can be oversensitive and focusing too much on herself. We discussed what the client could do to find more space in her life.  She does enjoy taking on projects that include research and study.  I encouraged the client to look at online classes that cover the topics that she is interested in.  She has found seminary classes very interesting.  She Dewayne Severe pursue that.  Interventions: Assertiveness/Communication, Motivational Interviewing, Solution-Oriented/Positive Psychology and Insight-Oriented  Diagnosis:   ICD-10-CM   1. Generalized anxiety disorder  F41.1     Plan: Assertiveness, boundaries, self-care, positive self talk, online courses, research project.  Gelene Mink Menachem [place], [facility]

## 2019-06-08 ENCOUNTER — Ambulatory Visit
Admission: RE | Admit: 2019-06-08 | Discharge: 2019-06-08 | Disposition: A | Discharge status: Home / Self Care | Payer: 59 | Source: Ambulatory Visit | Attending: Orthopedic Surgery | Admitting: Orthopedic Surgery

## 2019-06-08 ENCOUNTER — Other Ambulatory Visit: Payer: Self-pay

## 2019-06-08 DIAGNOSIS — M25561 Pain in right knee: Secondary | ICD-10-CM

## 2020-04-23 ENCOUNTER — Ambulatory Visit: Payer: 59 | Admitting: Psychiatry

## 2020-06-15 ENCOUNTER — Other Ambulatory Visit: Payer: Self-pay

## 2020-06-15 ENCOUNTER — Ambulatory Visit (INDEPENDENT_AMBULATORY_CARE_PROVIDER_SITE_OTHER): Payer: 59 | Admitting: Psychiatry

## 2020-06-15 ENCOUNTER — Encounter: Payer: Self-pay | Admitting: Psychiatry

## 2020-06-15 DIAGNOSIS — F411 Generalized anxiety disorder: Secondary | ICD-10-CM | POA: Diagnosis not present

## 2020-06-15 NOTE — Progress Notes (Signed)
      [facility] Counselor/Therapist Progress Note  Patient ID: Kayla Anthony, MRN: [id number],    Date: 06/15/2020  Time Spent: 50 minutes   Treatment Type: Individual Therapy  Reported Symptoms: anxiety  Mental Status Exam:  Appearance:   Casual and Well Groomed     Behavior:  Appropriate  Motor:  Normal  Speech/Language:   Clear and Coherent  Affect:  Appropriate  Mood:  anxious  Thought process:  normal  Thought content:    WNL  Sensory/Perceptual disturbances:    WNL  Orientation:  oriented to person, place, time/date and situation  Attention:  Good  Concentration:  Good  Memory:  WNL  Fund of knowledge:   Good  Insight:    Good  Judgment:   Good  Impulse Control:  Good   Risk Assessment: Danger to Self:  No Self-injurious Behavior: No Danger to Others: No Duty to Warn:no Physical Aggression / Violence:No  Access to Firearms a concern: No  Gang Involvement:No   Subjective: The client's daughter is graduating high school in June.  She states that she has been busy caring for their home and working with her daughter.  Her husband was recently diagnosed with type 2 diabetes.  She is completely revamped the way that they eat and her husband has lost 70 pounds.  "I am cooking all the time which stresses me out."  I discussed with the client what she does as a creative outlet to help her self?  She states that she recently has been painting which she enjoys quite a bit.  We discussed the necessity to maybe take some painting classes to improve her skill set which can help increase her creativity and satisfaction.  She agreed. Client has also been doing quite a bit of genealogy work with her family.  She has been able to trace her family back to the 83s in parts of [place] and [place].  In light of that as she has looked at her family of origin she realizes that her mother [name] very well be on the autism spectrum scale.  She realizes that her mother thinks incorrectly about  how life works and picks up social cues inaccurately.  This has been helpful information for the client as she restructures how she looks and life in the way she thinks.  I discussed with the client that I will be retiring at the end of April.  The client is unsure who she will follow-up with.  She will contact me later with other referrals.  Interventions: Assertiveness/Communication, Motivational Interviewing, Solution-Oriented/Positive Psychology and Insight-Oriented  Diagnosis:   ICD-10-CM   1. Generalized anxiety disorder  F41.1     Plan: Mood independent behavior, radical acceptance, positive self talk, self-care, art classes.  Kayla Anthony, [facility]

## 2021-03-07 IMAGING — MR MR KNEE*R* W/O CM
4 of 6 series · 19 of 40 positions shown · non-contrast
Comparison: None.

CLINICAL DATA: Right knee pain for several months. No known injury.

EXAM:
MRI OF THE RIGHT KNEE WITHOUT CONTRAST
TECHNIQUE: Multiplanar, multisequence MR imaging of the knee was performed. No
intravenous contrast was administered.

[Series 3: T2 fat-sat · axial · 4.0mm · 0.31mm/px · z∈[-34,+41]mm · 3 of 27 slices shown (1 of 2)]
[im 5/27]
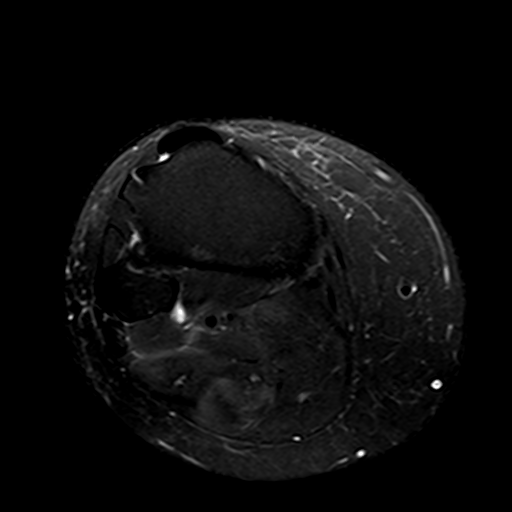
[im 14/27]
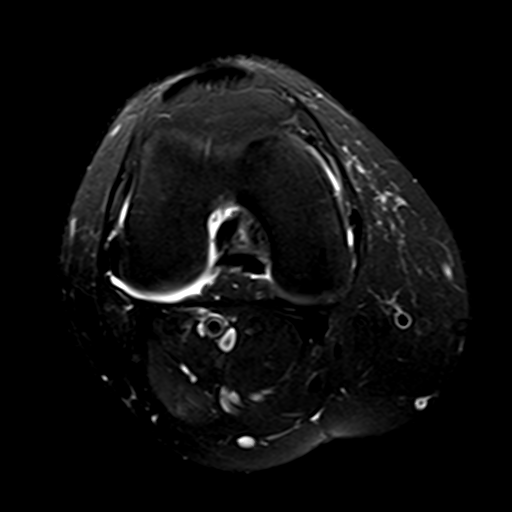
[im 22/27]
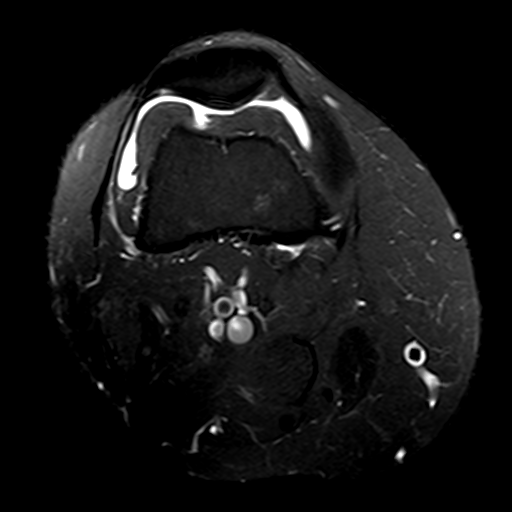

[Series 5: T2 fat-sat · coronal · 4.0mm · 0.29mm/px · 3 of 28 slices shown (2 of 2)]
[im 6/28]
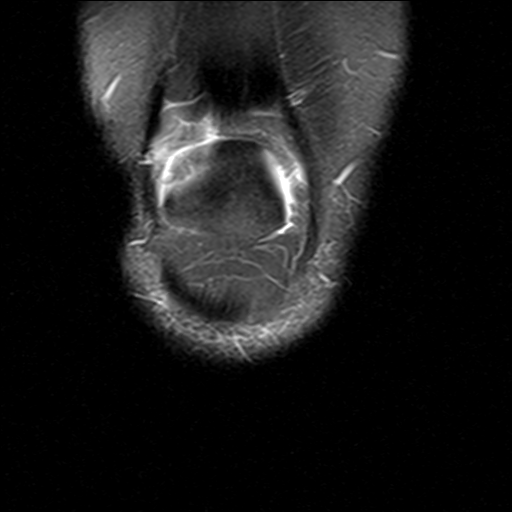
[im 17/28]
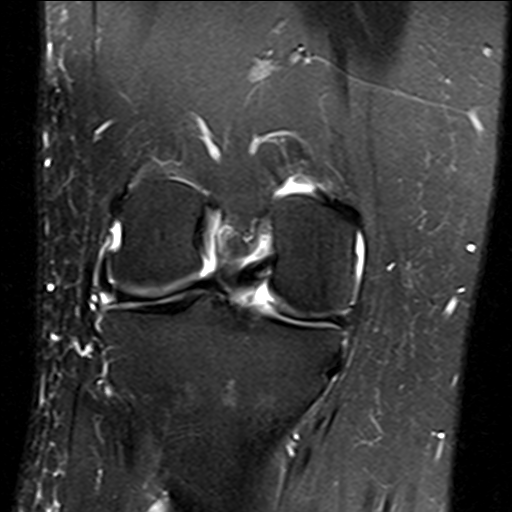
[im 28/28]
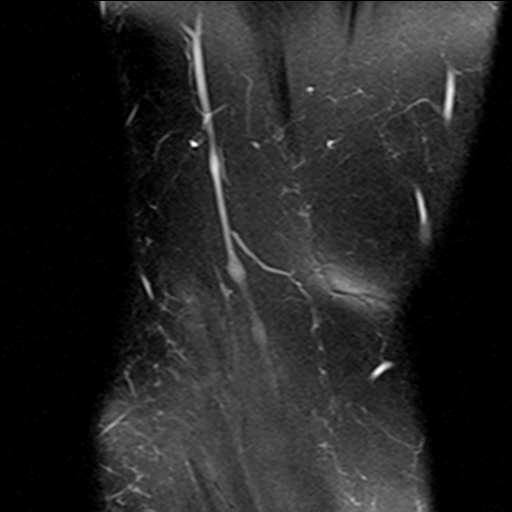

[Series 6: PD fat-sat · coronal · 3.0mm · 0.29mm/px · 8 of 33 slices shown (1 of 2)]
[im 1/33]
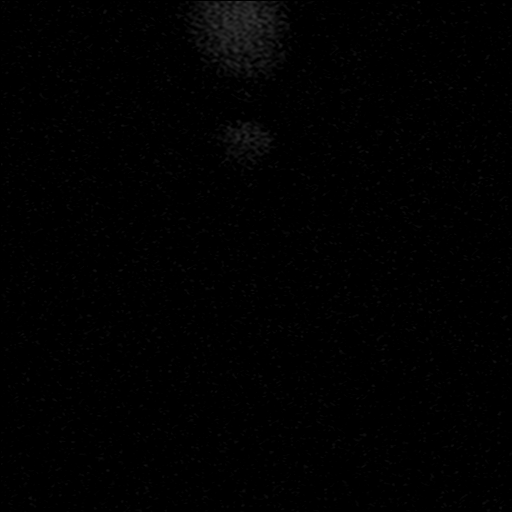
[im 5/33]
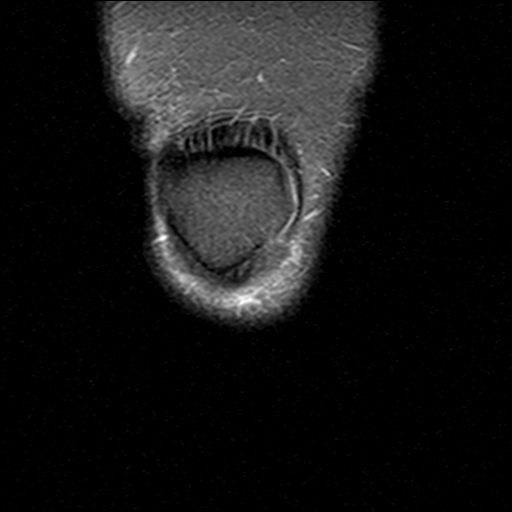
[im 10/33]
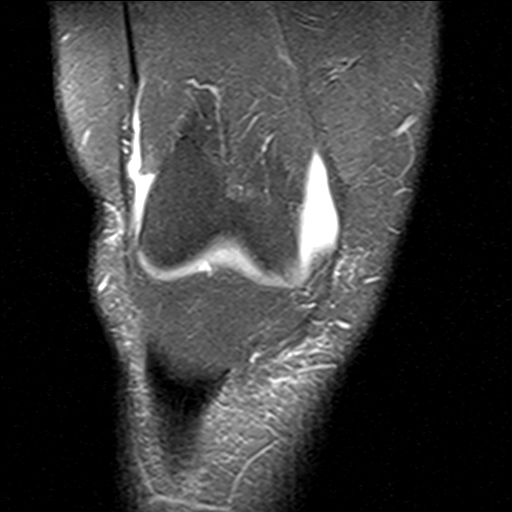
[im 14/33]
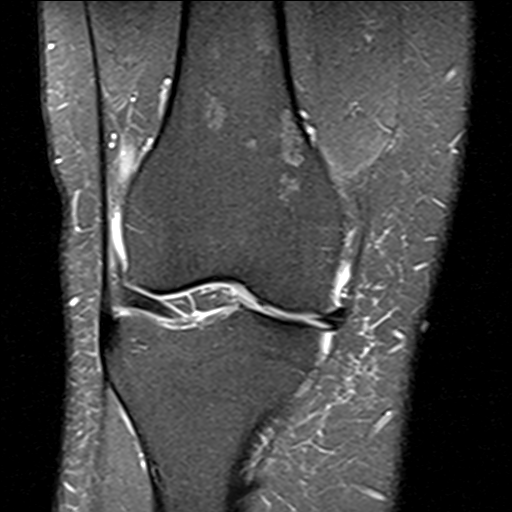
[im 19/33]
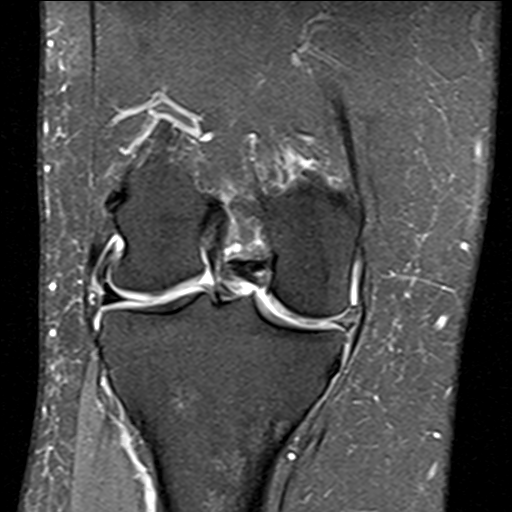
[im 23/33]
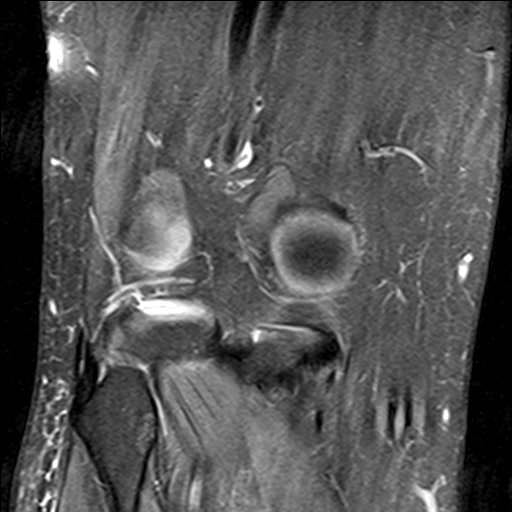
[im 28/33]
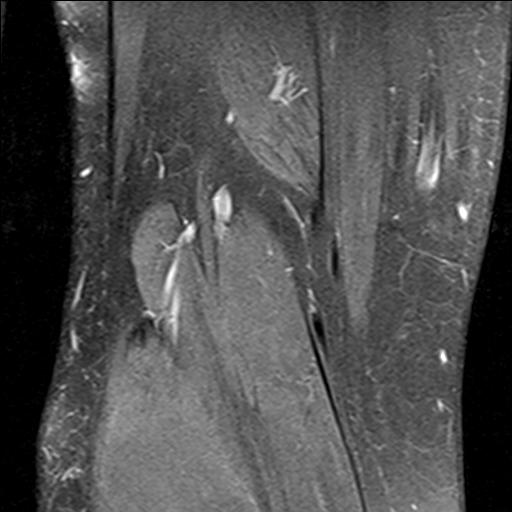
[im 33/33]
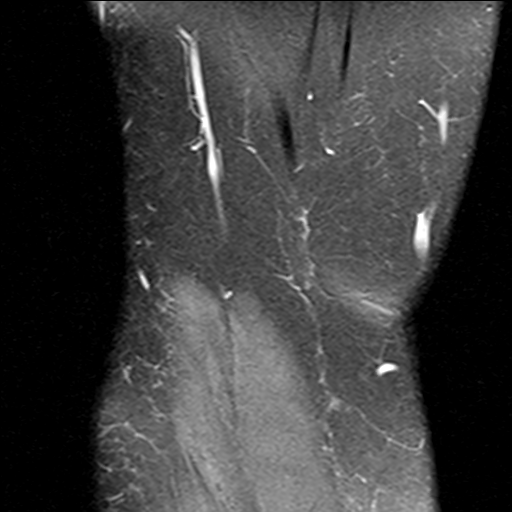

[Series 7: PD fat-sat · sagittal · 3.0mm · 0.29mm/px · 5 of 30 slices shown (2 of 2)]
[im 1/30]
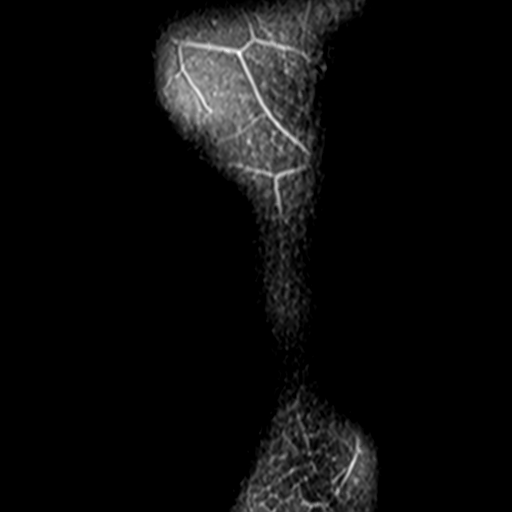
[im 5/30]
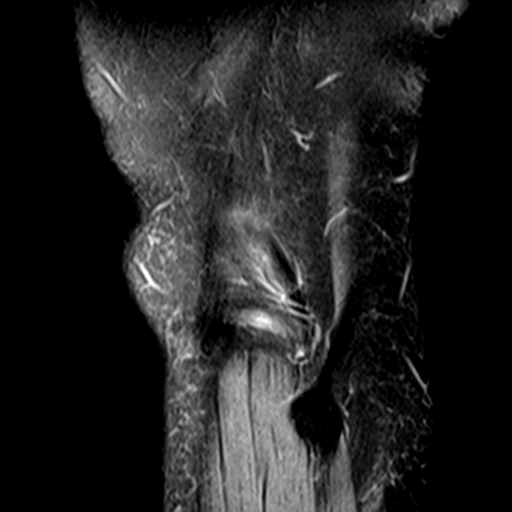
[im 10/30]
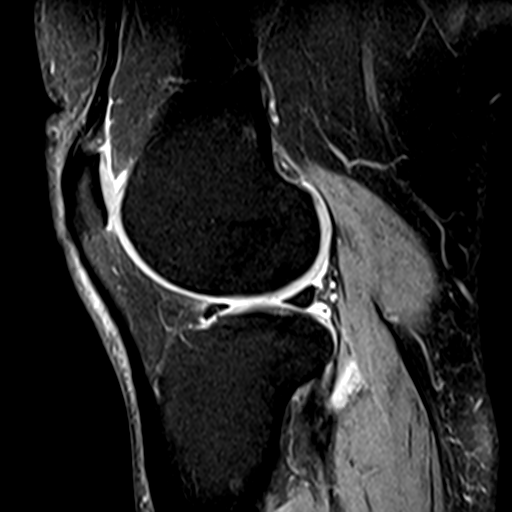
[im 15/30]
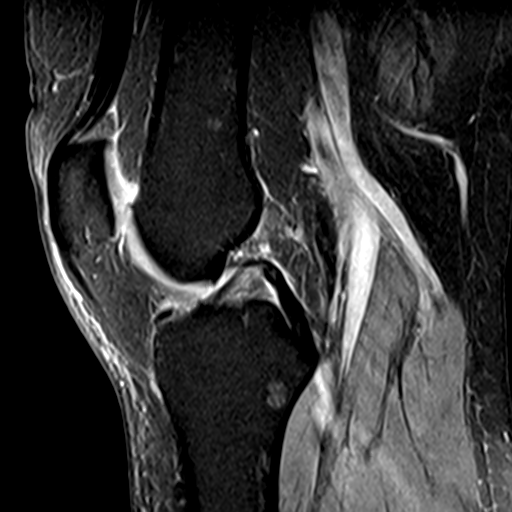
[im 25/30]
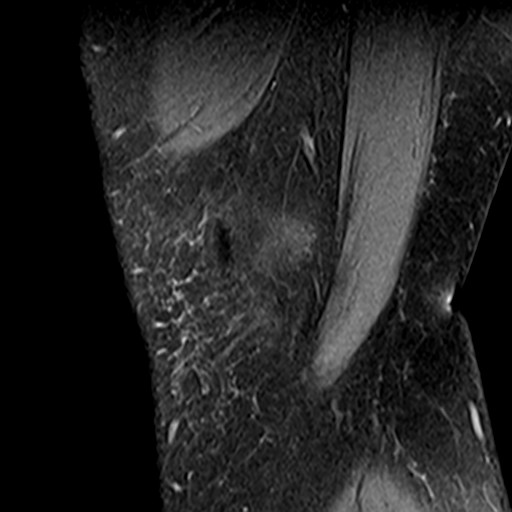

[19 of 40 positions shown; findings below may reference images not displayed]

FINDINGS: MENISCI

Medial meniscus: The body is severely degenerated and extruded
peripherally. Blunting along the free edge of the central body is
consistent with a radial tear. Also seen is a horizontal tear in the
body. No centrally displaced fragment.

Lateral meniscus:  Intact.

LIGAMENTS

Cruciates:  Intact.

Collaterals:  Intact.

CARTILAGE

Patellofemoral: Partial-thickness fissure is seen in hyaline
cartilage the apex of the patella in the midpole and the surface of
cartilage is mildly frayed in the midpole.

Medial:  Thinned with associated mild joint space narrowing.

Lateral:  Minimally degenerated.

Joint:  Small to moderate joint effusion.

Popliteal Fossa:  No Baker's cyst.

Extensor Mechanism:  Intact.

Bones: Small osteophytes are seen about the medial compartment. No
fracture, stress change or worrisome lesion.

Other: None.
IMPRESSION: Degenerated and diminutive body of the medial meniscus is extruded
peripherally. Blunting along the free edge of the central body is
consistent with radial tearing. There is also a horizontal tear in
the body.

Mild patellofemoral and moderate medial compartment osteoarthritis.

## 2021-04-12 DIAGNOSIS — J329 Chronic sinusitis, unspecified: Secondary | ICD-10-CM | POA: Diagnosis not present

## 2021-04-12 DIAGNOSIS — R002 Palpitations: Secondary | ICD-10-CM | POA: Diagnosis not present

## 2021-04-12 DIAGNOSIS — J40 Bronchitis, not specified as acute or chronic: Secondary | ICD-10-CM | POA: Diagnosis not present

## 2021-04-12 DIAGNOSIS — Z20828 Contact with and (suspected) exposure to other viral communicable diseases: Secondary | ICD-10-CM | POA: Diagnosis not present

## 2021-04-12 DIAGNOSIS — Z6839 Body mass index (BMI) 39.0-39.9, adult: Secondary | ICD-10-CM | POA: Diagnosis not present

## 2021-08-24 DIAGNOSIS — J0120 Acute ethmoidal sinusitis, unspecified: Secondary | ICD-10-CM | POA: Diagnosis not present

## 2021-10-11 DIAGNOSIS — H6501 Acute serous otitis media, right ear: Secondary | ICD-10-CM | POA: Diagnosis not present

## 2021-10-11 DIAGNOSIS — G542 Cervical root disorders, not elsewhere classified: Secondary | ICD-10-CM | POA: Diagnosis not present

## 2021-10-11 DIAGNOSIS — M545 Low back pain, unspecified: Secondary | ICD-10-CM | POA: Diagnosis not present

## 2021-10-11 DIAGNOSIS — G8929 Other chronic pain: Secondary | ICD-10-CM | POA: Diagnosis not present

## 2021-10-11 DIAGNOSIS — Z1331 Encounter for screening for depression: Secondary | ICD-10-CM | POA: Diagnosis not present

## 2021-11-08 DIAGNOSIS — M5412 Radiculopathy, cervical region: Secondary | ICD-10-CM | POA: Diagnosis not present

## 2021-11-08 DIAGNOSIS — R200 Anesthesia of skin: Secondary | ICD-10-CM | POA: Diagnosis not present

## 2021-11-08 DIAGNOSIS — M25532 Pain in left wrist: Secondary | ICD-10-CM | POA: Diagnosis not present

## 2021-11-08 DIAGNOSIS — Z6841 Body Mass Index (BMI) 40.0 and over, adult: Secondary | ICD-10-CM | POA: Diagnosis not present

## 2021-11-08 DIAGNOSIS — M25531 Pain in right wrist: Secondary | ICD-10-CM | POA: Diagnosis not present

## 2022-03-09 DIAGNOSIS — E538 Deficiency of other specified B group vitamins: Secondary | ICD-10-CM | POA: Diagnosis not present

## 2022-05-04 DIAGNOSIS — R509 Fever, unspecified: Secondary | ICD-10-CM | POA: Diagnosis not present

## 2023-05-04 DIAGNOSIS — Z6841 Body Mass Index (BMI) 40.0 and over, adult: Secondary | ICD-10-CM | POA: Diagnosis not present

## 2023-05-04 DIAGNOSIS — J019 Acute sinusitis, unspecified: Secondary | ICD-10-CM | POA: Diagnosis not present

## 2023-07-06 DIAGNOSIS — Z6841 Body Mass Index (BMI) 40.0 and over, adult: Secondary | ICD-10-CM | POA: Diagnosis not present

## 2023-07-06 DIAGNOSIS — E669 Obesity, unspecified: Secondary | ICD-10-CM | POA: Diagnosis not present

## 2023-07-06 DIAGNOSIS — M545 Low back pain, unspecified: Secondary | ICD-10-CM | POA: Diagnosis not present

## 2023-08-16 DIAGNOSIS — M545 Low back pain, unspecified: Secondary | ICD-10-CM | POA: Diagnosis not present

## 2023-08-16 DIAGNOSIS — M539 Dorsopathy, unspecified: Secondary | ICD-10-CM | POA: Diagnosis not present

## 2023-08-16 DIAGNOSIS — M5459 Other low back pain: Secondary | ICD-10-CM | POA: Diagnosis not present

## 2023-09-09 DIAGNOSIS — M5459 Other low back pain: Secondary | ICD-10-CM | POA: Diagnosis not present

## 2023-09-19 DIAGNOSIS — M47816 Spondylosis without myelopathy or radiculopathy, lumbar region: Secondary | ICD-10-CM | POA: Diagnosis not present

## 2023-09-19 DIAGNOSIS — M4316 Spondylolisthesis, lumbar region: Secondary | ICD-10-CM | POA: Diagnosis not present

## 2023-11-14 DIAGNOSIS — M47816 Spondylosis without myelopathy or radiculopathy, lumbar region: Secondary | ICD-10-CM | POA: Diagnosis not present

## 2024-03-24 DIAGNOSIS — R059 Cough, unspecified: Secondary | ICD-10-CM | POA: Diagnosis not present

## 2024-03-24 DIAGNOSIS — R0981 Nasal congestion: Secondary | ICD-10-CM | POA: Diagnosis not present

## 2024-03-24 DIAGNOSIS — J209 Acute bronchitis, unspecified: Secondary | ICD-10-CM | POA: Diagnosis not present
